# Patient Record
Sex: Male | Born: 1995
Health system: Southern US, Community
[De-identification: ages and names within clinical notes are randomized; demographics above are authoritative.]

## PROBLEM LIST (undated history)

## (undated) DIAGNOSIS — S27329A Contusion of lung, unspecified, initial encounter: Secondary | ICD-10-CM

## (undated) DIAGNOSIS — Z872 Personal history of diseases of the skin and subcutaneous tissue: Secondary | ICD-10-CM

## (undated) HISTORY — DX: Personal history of diseases of the skin and subcutaneous tissue: Z87.2

## (undated) HISTORY — PX: NO PAST SURGERIES: SHX2092

## (undated) HISTORY — DX: Contusion of lung, unspecified, initial encounter: S27.329A

---
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
## 2016-04-28 ENCOUNTER — Ambulatory Visit: Payer: Self-pay | Admitting: Licensed Clinical Social Worker

## 2018-04-14 DIAGNOSIS — S90111A Contusion of right great toe without damage to nail, initial encounter: Secondary | ICD-10-CM | POA: Diagnosis not present

## 2018-06-07 DIAGNOSIS — S27329A Contusion of lung, unspecified, initial encounter: Secondary | ICD-10-CM

## 2018-06-07 HISTORY — DX: Contusion of lung, unspecified, initial encounter: S27.329A

## 2018-06-19 ENCOUNTER — Emergency Department: Payer: 59

## 2018-06-19 ENCOUNTER — Other Ambulatory Visit: Payer: Self-pay

## 2018-06-19 ENCOUNTER — Encounter: Payer: Self-pay | Admitting: Emergency Medicine

## 2018-06-19 ENCOUNTER — Observation Stay
Admission: EM | Admit: 2018-06-19 | Discharge: 2018-06-21 | Disposition: A | Discharge status: Home / Self Care | Payer: 59 | Attending: Internal Medicine | Admitting: Internal Medicine

## 2018-06-19 DIAGNOSIS — R918 Other nonspecific abnormal finding of lung field: Secondary | ICD-10-CM | POA: Insufficient documentation

## 2018-06-19 DIAGNOSIS — R0602 Shortness of breath: Secondary | ICD-10-CM | POA: Insufficient documentation

## 2018-06-19 DIAGNOSIS — S27309A Unspecified injury of lung, unspecified, initial encounter: Secondary | ICD-10-CM | POA: Diagnosis not present

## 2018-06-19 DIAGNOSIS — S27329A Contusion of lung, unspecified, initial encounter: Secondary | ICD-10-CM | POA: Diagnosis present

## 2018-06-19 DIAGNOSIS — R079 Chest pain, unspecified: Secondary | ICD-10-CM | POA: Diagnosis not present

## 2018-06-19 DIAGNOSIS — S27322A Contusion of lung, bilateral, initial encounter: Principal | ICD-10-CM | POA: Diagnosis present

## 2018-06-19 DIAGNOSIS — S299XXA Unspecified injury of thorax, initial encounter: Secondary | ICD-10-CM | POA: Diagnosis not present

## 2018-06-19 DIAGNOSIS — S3991XA Unspecified injury of abdomen, initial encounter: Secondary | ICD-10-CM | POA: Diagnosis not present

## 2018-06-19 DIAGNOSIS — T1490XA Injury, unspecified, initial encounter: Secondary | ICD-10-CM

## 2018-06-19 DIAGNOSIS — R0789 Other chest pain: Secondary | ICD-10-CM | POA: Diagnosis not present

## 2018-06-19 DIAGNOSIS — W208XXA Other cause of strike by thrown, projected or falling object, initial encounter: Secondary | ICD-10-CM | POA: Diagnosis not present

## 2018-06-19 LAB — CBC WITH DIFFERENTIAL/PLATELET
BASOS PCT: 1 %
Basophils Absolute: 0.1 10*3/uL (ref 0–0.1)
Eosinophils Absolute: 0.1 10*3/uL (ref 0–0.7)
Eosinophils Relative: 2 %
HEMATOCRIT: 41.9 % (ref 40.0–52.0)
HEMOGLOBIN: 15.2 g/dL (ref 13.0–18.0)
Lymphocytes Relative: 28 %
Lymphs Abs: 2.5 10*3/uL (ref 1.0–3.6)
MCH: 30.1 pg (ref 26.0–34.0)
MCHC: 36.4 g/dL — AB (ref 32.0–36.0)
MCV: 82.8 fL (ref 80.0–100.0)
MONOS PCT: 7 %
Monocytes Absolute: 0.6 10*3/uL (ref 0.2–1.0)
NEUTROS PCT: 62 %
Neutro Abs: 5.5 10*3/uL (ref 1.4–6.5)
Platelets: 229 10*3/uL (ref 150–440)
RBC: 5.06 MIL/uL (ref 4.40–5.90)
RDW: 13 % (ref 11.5–14.5)
WBC: 8.8 10*3/uL (ref 3.8–10.6)

## 2018-06-19 LAB — BASIC METABOLIC PANEL
ANION GAP: 9 (ref 5–15)
BUN: 17 mg/dL (ref 6–20)
CHLORIDE: 106 mmol/L (ref 98–111)
CO2: 24 mmol/L (ref 22–32)
CREATININE: 1.03 mg/dL (ref 0.61–1.24)
Calcium: 9.2 mg/dL (ref 8.9–10.3)
GFR calc non Af Amer: >60 mL/min (ref >60)
GLUCOSE: 75 mg/dL (ref 70–99)
Potassium: 3.8 mmol/L (ref 3.5–5.1)
Sodium: 139 mmol/L (ref 135–145)

## 2018-06-19 LAB — TYPE AND SCREEN
ABO/RH(D): A POS
ANTIBODY SCREEN: NEGATIVE

## 2018-06-19 LAB — TROPONIN I: Troponin I: <0.03 ng/mL (ref <0.03)

## 2018-06-19 MED ORDER — ACETAMINOPHEN 325 MG PO TABS: 650.0000 mg | ORAL_TABLET | Freq: Four times a day (QID) | ORAL | Status: DC | PRN

## 2018-06-19 MED ORDER — ONDANSETRON HCL 4 MG/2ML IJ SOLN
4.0000 mg | Freq: Once | INTRAMUSCULAR | Status: AC
  Administered 2018-06-19: 4 mg via INTRAVENOUS
  Filled 2018-06-19: qty 2

## 2018-06-19 MED ORDER — FENTANYL CITRATE (PF) 100 MCG/2ML IJ SOLN
50.0000 ug | Freq: Once | INTRAMUSCULAR | Status: AC
  Administered 2018-06-19: 50 ug via INTRAVENOUS

## 2018-06-19 MED ORDER — MORPHINE SULFATE (PF) 4 MG/ML IV SOLN
4.0000 mg | INTRAVENOUS | Status: DC | PRN
  Administered 2018-06-19 – 2018-06-21 (×5): 4 mg via INTRAVENOUS
  Filled 2018-06-19 (×5): qty 1

## 2018-06-19 MED ORDER — ACETAMINOPHEN 650 MG RE SUPP: 650.0000 mg | Freq: Four times a day (QID) | RECTAL | Status: DC | PRN

## 2018-06-19 MED ORDER — OXYCODONE HCL 5 MG PO TABS
5.0000 mg | ORAL_TABLET | ORAL | Status: DC | PRN
  Administered 2018-06-20 – 2018-06-21 (×5): 5 mg via ORAL
  Filled 2018-06-19 (×5): qty 1

## 2018-06-19 MED ORDER — ONDANSETRON HCL 4 MG/2ML IJ SOLN: 4.0000 mg | Freq: Four times a day (QID) | INTRAMUSCULAR | Status: DC | PRN

## 2018-06-19 MED ORDER — HYDROMORPHONE HCL 1 MG/ML IJ SOLN
0.5000 mg | INTRAMUSCULAR | Status: AC
  Administered 2018-06-19: 0.5 mg via INTRAVENOUS
  Filled 2018-06-19: qty 1

## 2018-06-19 MED ORDER — IOHEXOL 300 MG/ML  SOLN
100.0000 mL | Freq: Once | INTRAMUSCULAR | Status: AC | PRN
  Administered 2018-06-19: 100 mL via INTRAVENOUS

## 2018-06-19 MED ORDER — ONDANSETRON HCL 4 MG PO TABS: 4.0000 mg | ORAL_TABLET | Freq: Four times a day (QID) | ORAL | Status: DC | PRN

## 2018-06-19 MED ORDER — FENTANYL CITRATE (PF) 100 MCG/2ML IJ SOLN
INTRAMUSCULAR | Status: AC
  Filled 2018-06-19: qty 2

## 2018-06-19 MED ORDER — FENTANYL CITRATE (PF) 100 MCG/2ML IJ SOLN
75.0000 ug | Freq: Once | INTRAMUSCULAR | Status: AC
  Administered 2018-06-19: 75 ug via INTRAVENOUS
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
## 2018-06-20 ENCOUNTER — Observation Stay: Payer: 59

## 2018-06-20 DIAGNOSIS — R918 Other nonspecific abnormal finding of lung field: Secondary | ICD-10-CM | POA: Diagnosis not present

## 2018-06-20 DIAGNOSIS — S27322A Contusion of lung, bilateral, initial encounter: Secondary | ICD-10-CM | POA: Diagnosis not present

## 2018-06-20 DIAGNOSIS — R0602 Shortness of breath: Secondary | ICD-10-CM | POA: Diagnosis not present

## 2018-06-20 LAB — BASIC METABOLIC PANEL
ANION GAP: 6 (ref 5–15)
BUN: 14 mg/dL (ref 6–20)
CO2: 28 mmol/L (ref 22–32)
Calcium: 9 mg/dL (ref 8.9–10.3)
Chloride: 107 mmol/L (ref 98–111)
Creatinine, Ser: 0.91 mg/dL (ref 0.61–1.24)
GFR calc Af Amer: >60 mL/min (ref >60)
Glucose, Bld: 97 mg/dL (ref 70–99)
Potassium: 3.6 mmol/L (ref 3.5–5.1)
SODIUM: 141 mmol/L (ref 135–145)

## 2018-06-20 LAB — CBC
HCT: 40.5 % (ref 40.0–52.0)
Hemoglobin: 14.1 g/dL (ref 13.0–18.0)
MCH: 29.3 pg (ref 26.0–34.0)
MCHC: 34.8 g/dL (ref 32.0–36.0)
MCV: 84.2 fL (ref 80.0–100.0)
Platelets: 210 10*3/uL (ref 150–440)
RBC: 4.81 MIL/uL (ref 4.40–5.90)
RDW: 13 % (ref 11.5–14.5)
WBC: 9.6 10*3/uL (ref 3.8–10.6)

## 2018-06-20 MED ORDER — IBUPROFEN 400 MG PO TABS
400.0000 mg | ORAL_TABLET | Freq: Four times a day (QID) | ORAL | Status: DC
  Administered 2018-06-20 – 2018-06-21 (×3): 400 mg via ORAL
  Filled 2018-06-20 (×4): qty 1
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
## 2018-06-21 ENCOUNTER — Telehealth: Payer: Self-pay | Admitting: Pediatrics

## 2018-06-21 DIAGNOSIS — S27322A Contusion of lung, bilateral, initial encounter: Secondary | ICD-10-CM | POA: Diagnosis not present

## 2018-06-21 DIAGNOSIS — R0602 Shortness of breath: Secondary | ICD-10-CM | POA: Diagnosis not present

## 2018-06-21 DIAGNOSIS — R918 Other nonspecific abnormal finding of lung field: Secondary | ICD-10-CM | POA: Diagnosis not present

## 2018-06-21 MED ORDER — IBUPROFEN 400 MG PO TABS: 400.0000 mg | ORAL_TABLET | Freq: Four times a day (QID) | ORAL | 0 refills | Status: DC | PRN

## 2018-06-21 MED ORDER — OXYCODONE HCL 5 MG PO TABS: 5.0000 mg | ORAL_TABLET | Freq: Three times a day (TID) | ORAL | 0 refills | Status: DC | PRN

## 2018-06-21 MED ORDER — ACETAMINOPHEN 325 MG PO TABS: 650.0000 mg | ORAL_TABLET | Freq: Four times a day (QID) | ORAL | Status: DC | PRN

## 2018-06-21 NOTE — Telephone Encounter (Signed)
PT called back and I scheduled pt.

## 2018-06-21 NOTE — Telephone Encounter (Signed)
I called the number in the chart that said it was for him and the name on the message was not his or his mother.  I called the home # we have on file and left a message to call the office.

## 2018-06-21 NOTE — Progress Notes (Signed)
Discharge order received. Patient is alert and oriented. Vital signs stable . No signs of acute distress. Discharge instructions given. Patient verbalized understanding. No other issues noted at this time.   

## 2018-06-21 NOTE — Telephone Encounter (Signed)
Okay to add on Thursday at about 1:30PM

## 2018-06-21 NOTE — Telephone Encounter (Signed)
Pt is set up to establish care with Dr Silvio Pate Dec 2019 but needs HFU by Fri 7/19. His past pcp was pediatrician. Is it OK to schedule pt for HFU?  Copied from [place] [phone]. Topic: Appointment Scheduling - Scheduling Inquiry for Clinic >> Jun 21, 2018  9:38 AM Margot Ables wrote: Reason for CRM:  [06/21/2018 9:19 AM]  Inocente Salles:   I have [facility] on the line wanting to schedule hosp f/u for pt who has not been seen as new pt yet but is scheduled with Dr. Silvio Pate. needs seen w/in 6 days. Dr. Silvio Pate doesn't have opening. Can I put with NP?   [06/21/2018 9:22 AM]  Inocente Salles:   Let me know how to proceed.   [06/21/2018 9:25 AM]  Inocente Salles:   I scheduled pt with NP. Please review. MRN [id number]   [06/21/2018 9:32 AM]  Ozzie Hoyle:   Hey sorry for delay was on phone. I spoke with front office mgr and Allie Bossier NP cannot see pt. at this point you could send CRM and we could ck with Dr Silvio Pate to see if he would see pt prior to establishing care but otherwise pt would need to see previous PCP.  I have cancelled the appt - please f/u with the patient on how to proceed. Thank you
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
## 2018-06-22 LAB — HIV ANTIBODY (ROUTINE TESTING W REFLEX): HIV Screen 4th Generation wRfx: NONREACTIVE

## 2018-06-24 ENCOUNTER — Inpatient Hospital Stay: Payer: 59 | Admitting: Internal Medicine

## 2018-06-28 ENCOUNTER — Inpatient Hospital Stay: Payer: 59 | Admitting: Primary Care

## 2018-07-08 ENCOUNTER — Ambulatory Visit (INDEPENDENT_AMBULATORY_CARE_PROVIDER_SITE_OTHER): Payer: 59 | Admitting: Internal Medicine

## 2018-07-08 ENCOUNTER — Encounter: Payer: Self-pay | Admitting: Internal Medicine

## 2018-07-08 VITALS — BP 102/70 | HR 54 | Temp 97.6°F | Ht 71.5 in | Wt 166.0 lb

## 2018-07-08 DIAGNOSIS — Z0000 Encounter for general adult medical examination without abnormal findings: Secondary | ICD-10-CM | POA: Diagnosis not present

## 2018-07-08 DIAGNOSIS — S27322S Contusion of lung, bilateral, sequela: Secondary | ICD-10-CM
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
## 2018-07-08 NOTE — Assessment & Plan Note (Signed)
Healthy Counseling done Will check HIV Yearly flu vaccine recommended He thinks he got Td at [facility] 2 years ago--- he will check

## 2018-07-08 NOTE — Assessment & Plan Note (Signed)
Still has some chest, back and neck symptoms that are likely related to this Discussed that I expect resolution over time

## 2018-07-08 NOTE — Patient Instructions (Signed)
Please remember to get the immunization record from [facility] and send it to me

## 2018-07-10 LAB — HIV ANTIBODY (ROUTINE TESTING W REFLEX): HIV 1&2 Ab, 4th Generation: NONREACTIVE

## 2018-12-07 ENCOUNTER — Ambulatory Visit: Payer: Self-pay | Admitting: Internal Medicine

## 2018-12-31 DIAGNOSIS — L700 Acne vulgaris: Secondary | ICD-10-CM | POA: Diagnosis not present

## 2019-01-13 DIAGNOSIS — J3489 Other specified disorders of nose and nasal sinuses: Secondary | ICD-10-CM | POA: Diagnosis not present

## 2019-01-13 DIAGNOSIS — J309 Allergic rhinitis, unspecified: Secondary | ICD-10-CM | POA: Diagnosis not present

## 2019-02-14 DIAGNOSIS — J309 Allergic rhinitis, unspecified: Secondary | ICD-10-CM | POA: Diagnosis not present

## 2019-02-14 DIAGNOSIS — J300 Vasomotor rhinitis: Secondary | ICD-10-CM | POA: Diagnosis not present

## 2019-02-14 DIAGNOSIS — J3489 Other specified disorders of nose and nasal sinuses: Secondary | ICD-10-CM | POA: Diagnosis not present

## 2019-05-05 DIAGNOSIS — L700 Acne vulgaris: Secondary | ICD-10-CM | POA: Diagnosis not present

## 2019-05-05 DIAGNOSIS — D2262 Melanocytic nevi of left upper limb, including shoulder: Secondary | ICD-10-CM | POA: Diagnosis not present

## 2019-05-05 DIAGNOSIS — D2261 Melanocytic nevi of right upper limb, including shoulder: Secondary | ICD-10-CM | POA: Diagnosis not present

## 2019-05-05 DIAGNOSIS — D2271 Melanocytic nevi of right lower limb, including hip: Secondary | ICD-10-CM | POA: Diagnosis not present

## 2019-05-05 DIAGNOSIS — D485 Neoplasm of uncertain behavior of skin: Secondary | ICD-10-CM | POA: Diagnosis not present

## 2019-05-05 DIAGNOSIS — D2272 Melanocytic nevi of left lower limb, including hip: Secondary | ICD-10-CM | POA: Diagnosis not present

## 2019-05-05 DIAGNOSIS — D224 Melanocytic nevi of scalp and neck: Secondary | ICD-10-CM | POA: Diagnosis not present

## 2019-05-05 DIAGNOSIS — D225 Melanocytic nevi of trunk: Secondary | ICD-10-CM | POA: Diagnosis not present

## 2019-06-05 DIAGNOSIS — Z1159 Encounter for screening for other viral diseases: Secondary | ICD-10-CM | POA: Diagnosis not present
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
## 2020-03-26 IMAGING — CR DG CHEST 2V
2 series · 2 of 2 positions shown · non-contrast
Comparison: 06/19/2018

CLINICAL DATA: Patient reports continued SOB and difficulty
breathing following a trauma to his chest yesterday. Patient denies
any known heart or lung conditions. Non-smoker.

EXAM:
CHEST - 2 VIEW

[chest pa]
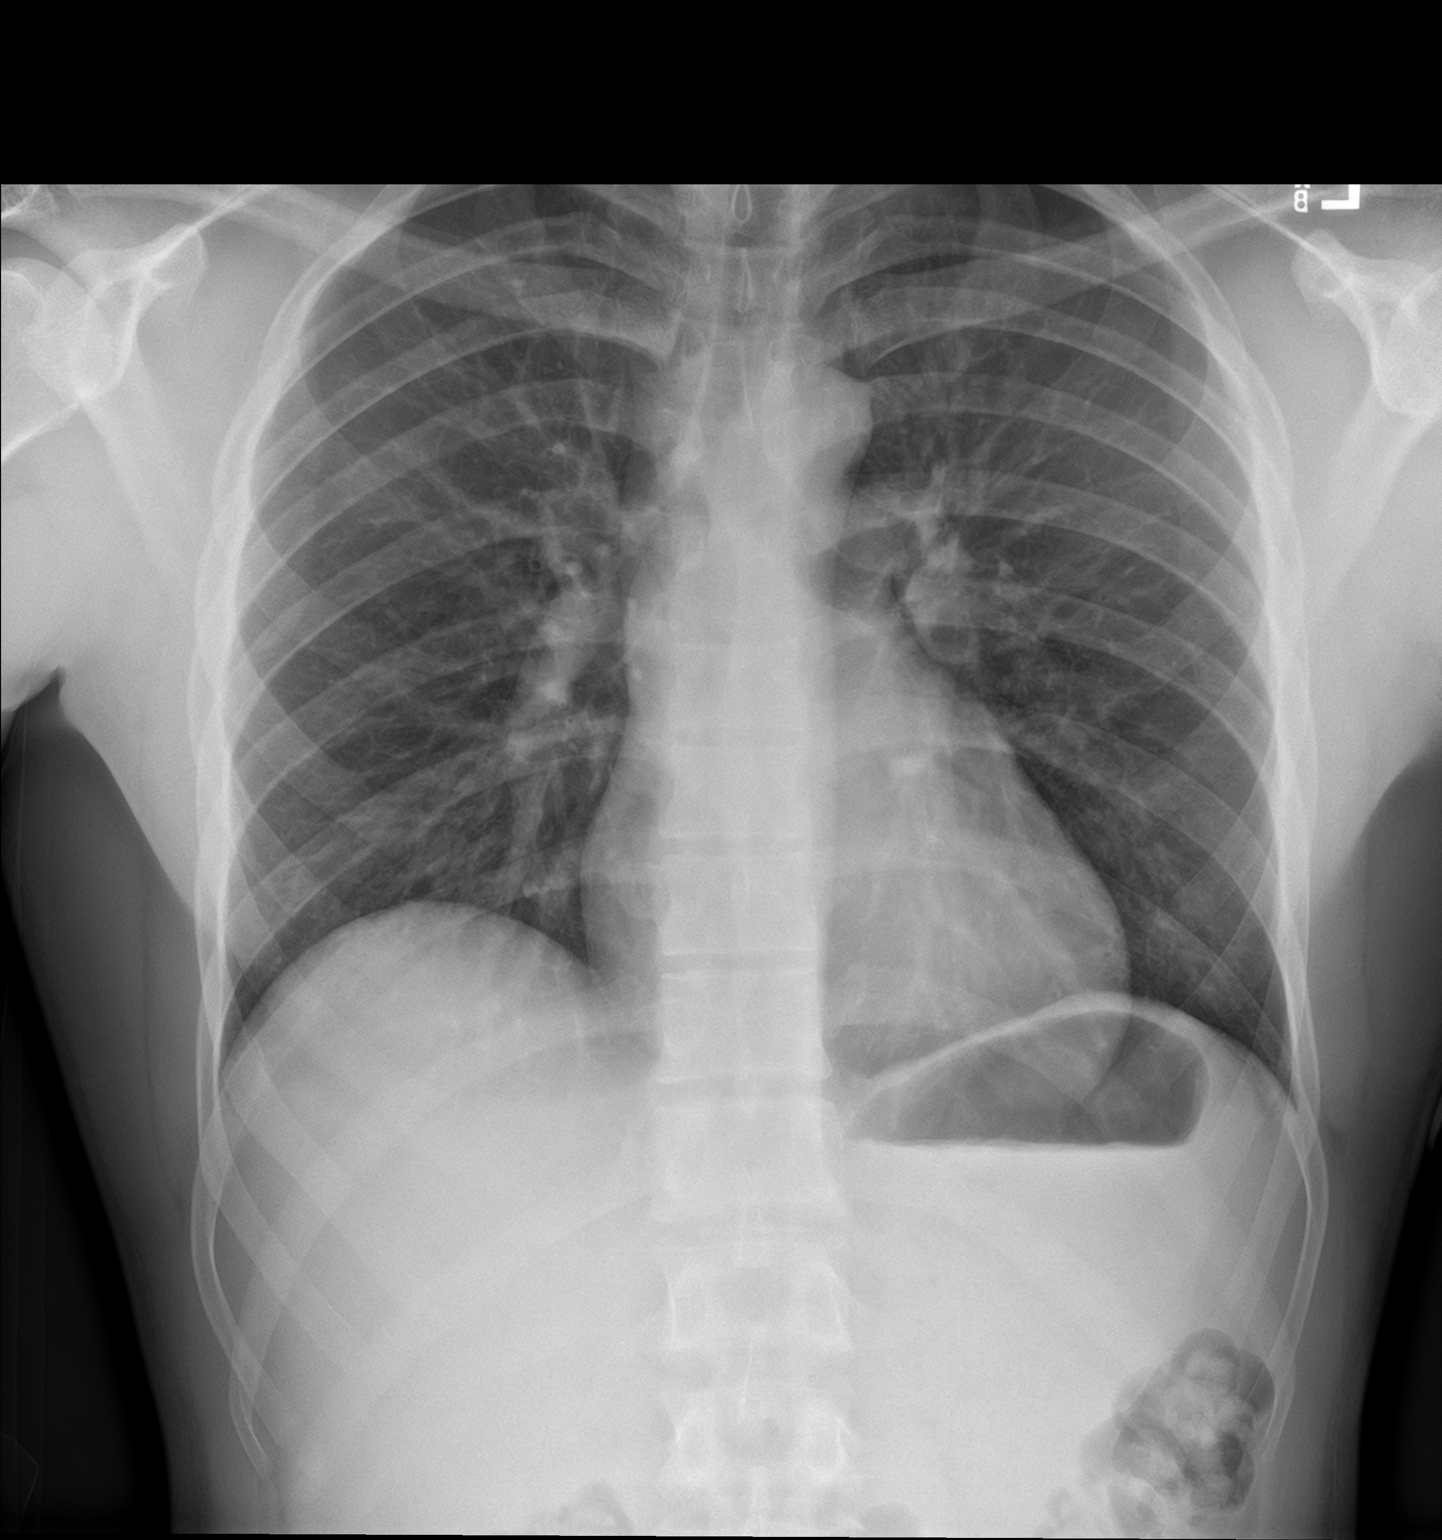

[chest lat]
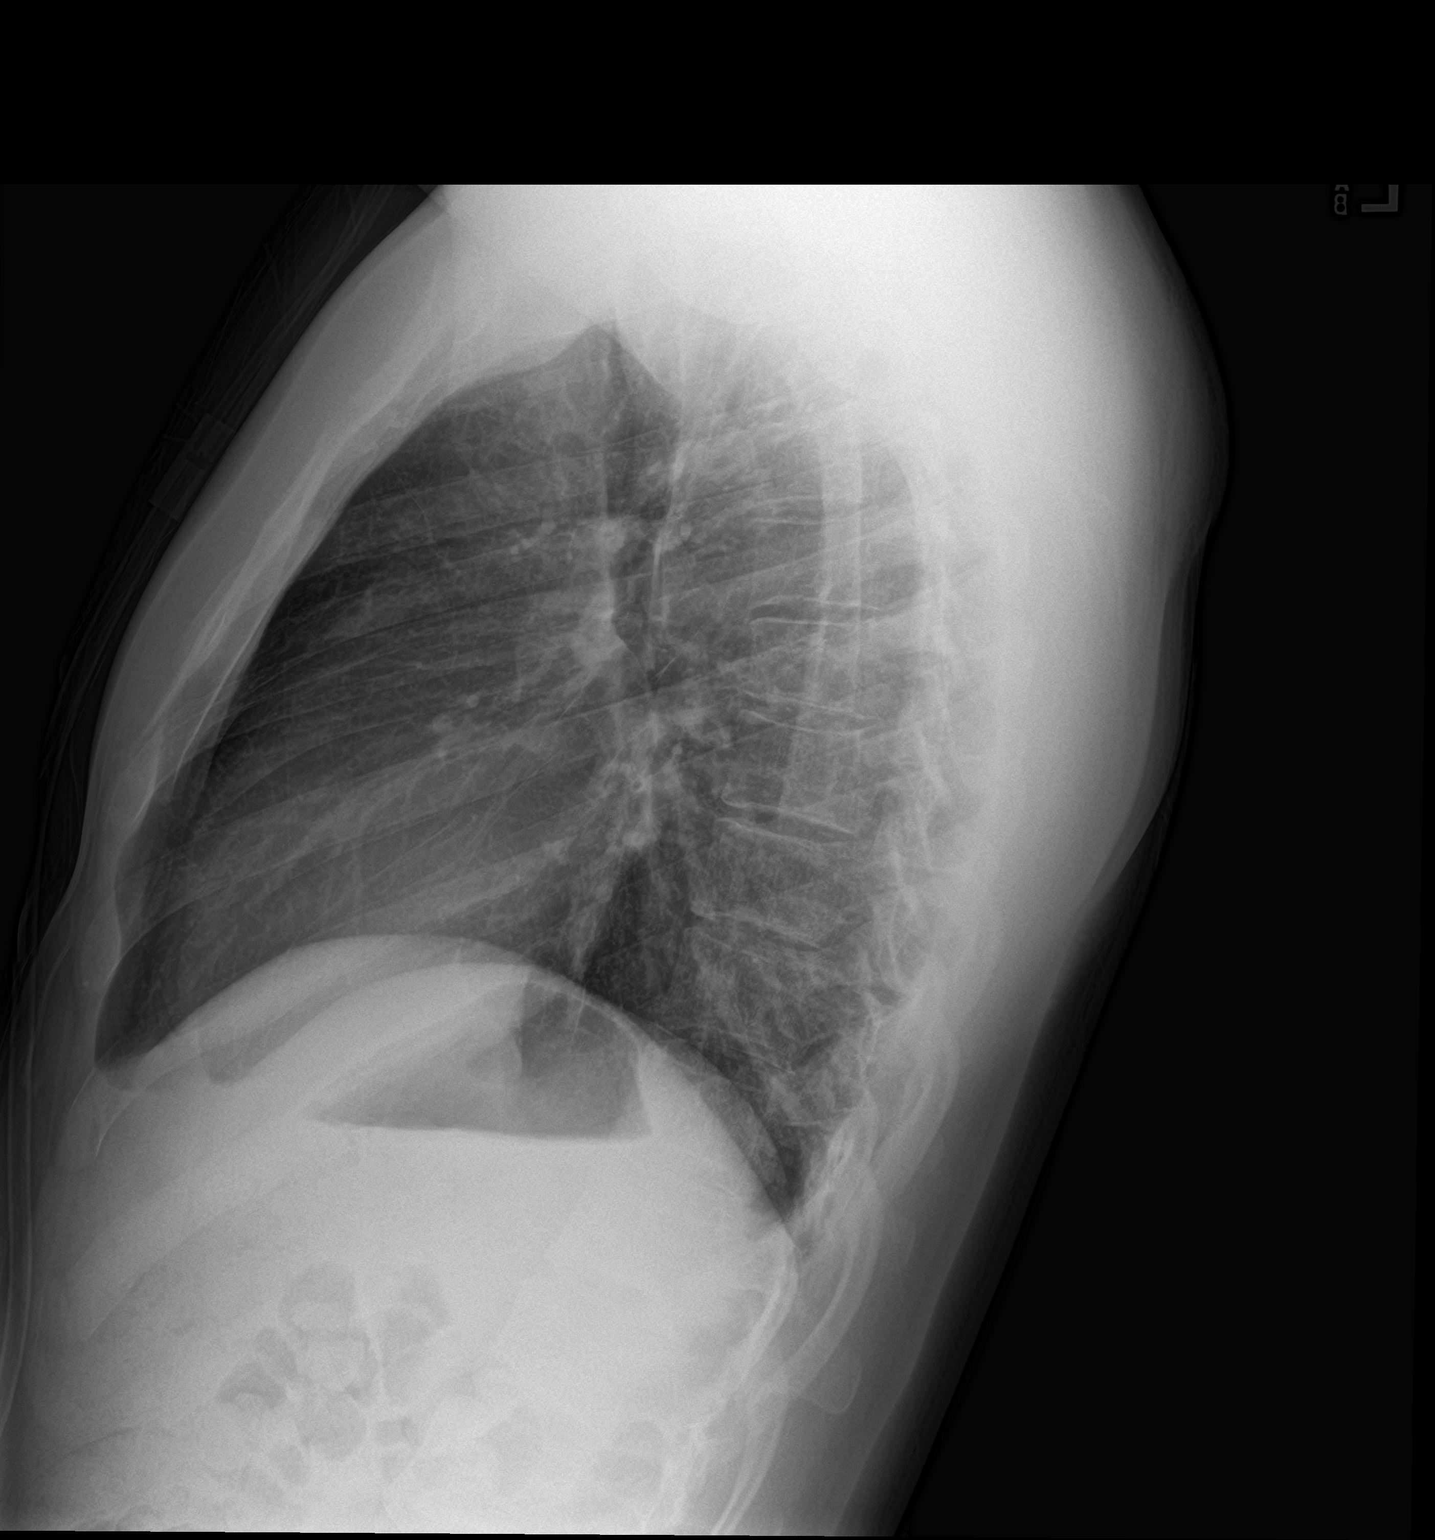

[2 of 2 positions shown; findings below may reference images not displayed]

FINDINGS: The heart size and mediastinal contours are within normal limits.
Both lungs are clear. The visualized skeletal structures are
unremarkable. No pneumothorax.
IMPRESSION: No active cardiopulmonary disease.

## 2020-05-21 DIAGNOSIS — Z20822 Contact with and (suspected) exposure to covid-19: Secondary | ICD-10-CM | POA: Diagnosis not present

## 2020-09-06 DIAGNOSIS — F419 Anxiety disorder, unspecified: Secondary | ICD-10-CM | POA: Insufficient documentation

## 2021-01-31 DIAGNOSIS — H5213 Myopia, bilateral: Secondary | ICD-10-CM | POA: Diagnosis not present

## 2022-11-13 ENCOUNTER — Ambulatory Visit (INDEPENDENT_AMBULATORY_CARE_PROVIDER_SITE_OTHER): Payer: BC Managed Care – PPO | Admitting: Internal Medicine

## 2022-11-13 ENCOUNTER — Encounter: Payer: Self-pay | Admitting: Internal Medicine

## 2022-11-13 VITALS — BP 98/70 | HR 68 | Temp 97.3°F | Ht 71.5 in | Wt 195.0 lb

## 2022-11-13 DIAGNOSIS — Z23 Encounter for immunization: Secondary | ICD-10-CM | POA: Diagnosis not present

## 2022-11-13 DIAGNOSIS — R002 Palpitations: Secondary | ICD-10-CM

## 2022-11-13 DIAGNOSIS — Z0000 Encounter for general adult medical examination without abnormal findings: Secondary | ICD-10-CM | POA: Diagnosis not present

## 2022-11-13 LAB — CBC
HCT: 44.6 % (ref 39.0–52.0)
Hemoglobin: 15.3 g/dL (ref 13.0–17.0)
MCHC: 34.3 g/dL (ref 30.0–36.0)
MCV: 83.6 fl (ref 78.0–100.0)
Platelets: 251 10*3/uL (ref 150.0–400.0)
RBC: 5.34 Mil/uL (ref 4.22–5.81)
RDW: 12.6 % (ref 11.5–15.5)
WBC: 5.7 10*3/uL (ref 4.0–10.5)

## 2022-11-13 LAB — COMPREHENSIVE METABOLIC PANEL
ALT: 14 U/L (ref 0–53)
AST: 15 U/L (ref 0–37)
Albumin: 4.9 g/dL (ref 3.5–5.2)
Alkaline Phosphatase: 55 U/L (ref 39–117)
BUN: 21 mg/dL (ref 6–23)
CO2: 31 mEq/L (ref 19–32)
Calcium: 9.7 mg/dL (ref 8.4–10.5)
Chloride: 102 mEq/L (ref 96–112)
Creatinine, Ser: 1.14 mg/dL (ref 0.40–1.50)
GFR: 87.93 mL/min (ref >60.00)
Glucose, Bld: 88 mg/dL (ref 70–99)
Potassium: 4.1 mEq/L (ref 3.5–5.1)
Sodium: 138 mEq/L (ref 135–145)
Total Bilirubin: 1.8 mg/dL — ABNORMAL HIGH (ref 0.2–1.2)
Total Protein: 7.2 g/dL (ref 6.0–8.3)

## 2022-11-13 LAB — LIPID PANEL
Cholesterol: 187 mg/dL (ref 0–200)
HDL: 58.8 mg/dL (ref >39.00)
LDL Cholesterol: 103 mg/dL — ABNORMAL HIGH (ref 0–99)
NonHDL: 128.16
Total CHOL/HDL Ratio: 3
Triglycerides: 126 mg/dL (ref 0.0–149.0)
VLDL: 25.2 mg/dL (ref 0.0–40.0)

## 2022-11-13 LAB — TSH: TSH: 1.73 u[IU]/mL (ref 0.35–5.50)

## 2022-11-13 NOTE — Assessment & Plan Note (Signed)
Healthy Tdap today Stays in shape Prefers no flu or COVID vaccines

## 2022-11-13 NOTE — Assessment & Plan Note (Signed)
Mild spell ?caffeine related--though several other spells after stopping Will just check labs

## 2022-11-13 NOTE — Addendum Note (Signed)
Addended by: Pilar Grammes on: 11/13/2022 12:33 PM   Modules accepted: Orders

## 2022-11-13 NOTE — Progress Notes (Signed)
Subjective:    Patient ID: Jerry Werner, male    DOB: Aug 10, 1996, 26 y.o.   MRN: [id number]  HPI Here to re-establish care and for physical It has been over 4 years  Still notes stress with work/home issues Was in gym--got tightness in chest and vision "narrowing down a little"---last week Went away but has recurred twice since then Not similar to past symptoms---more like social avoidance in the past Therapy in the past--felt this is better Quit energy drinks/caffeine after spell  No change in exercise regimen--regular in gym Cardio and weights  Stress at work In relationship ---overall a good thing Trouble initiating sleep--but then sleeps okay. Eats before bed--it helps  Review of Systems  Constitutional:  Negative for fatigue.       Weight is up with body building Wears seat belt  HENT:  Negative for dental problem, hearing loss and tinnitus.        Due for dental visit  Eyes:  Negative for visual disturbance.       No diplopia or unilateral vision loss  Respiratory:  Negative for cough, chest tightness and shortness of breath.   Cardiovascular:  Positive for palpitations. Negative for chest pain and leg swelling.  Gastrointestinal:  Negative for blood in stool and constipation.       Occ indigestion---no meds  Endocrine: Negative for polydipsia and polyuria.  Genitourinary:  Negative for difficulty urinating and urgency.       No sexual problems  Musculoskeletal:  Negative for arthralgias, back pain and joint swelling.       Some muscle pains--like in pecs  Skin:  Negative for rash.  Allergic/Immunologic: Positive for environmental allergies. Negative for immunocompromised state.       Hasn't needed meds lately  Neurological:  Negative for syncope and light-headedness.       Rare stress headaches  Hematological:  Negative for adenopathy. Does not bruise/bleed easily.  Psychiatric/Behavioral:         Anxious in the past week--but not generally No depression        Objective:   Physical Exam Constitutional:      Appearance: Normal appearance.  HENT:     Mouth/Throat:     Pharynx: No oropharyngeal exudate or posterior oropharyngeal erythema.  Eyes:     Conjunctiva/sclera: Conjunctivae normal.     Pupils: Pupils are equal, round, and reactive to light.  Cardiovascular:     Rate and Rhythm: Normal rate and regular rhythm.     Pulses: Normal pulses.     Heart sounds: No murmur heard.    No gallop.  Pulmonary:     Effort: Pulmonary effort is normal.     Breath sounds: Normal breath sounds. No wheezing or rales.  Abdominal:     Palpations: Abdomen is soft.     Tenderness: There is no abdominal tenderness.  Genitourinary:    Testes: Normal.  Musculoskeletal:     Cervical back: Neck supple.     Right lower leg: No edema.     Left lower leg: No edema.  Lymphadenopathy:     Cervical: No cervical adenopathy.  Skin:    Findings: No lesion or rash.     Comments: Numerous nevi (sees derm regularly)  Neurological:     General: No focal deficit present.     Mental Status: He is alert and oriented to person, place, and time.  Psychiatric:        Mood and Affect: Mood normal.  Behavior: Behavior normal.            Assessment & Plan:

## 2023-02-15 DIAGNOSIS — S335XXA Sprain of ligaments of lumbar spine, initial encounter: Secondary | ICD-10-CM | POA: Diagnosis not present

## 2023-08-06 DIAGNOSIS — D225 Melanocytic nevi of trunk: Secondary | ICD-10-CM | POA: Diagnosis not present

## 2023-08-06 DIAGNOSIS — L700 Acne vulgaris: Secondary | ICD-10-CM | POA: Diagnosis not present

## 2023-08-06 DIAGNOSIS — D2262 Melanocytic nevi of left upper limb, including shoulder: Secondary | ICD-10-CM | POA: Diagnosis not present

## 2023-08-06 DIAGNOSIS — D2261 Melanocytic nevi of right upper limb, including shoulder: Secondary | ICD-10-CM | POA: Diagnosis not present

## 2023-10-22 ENCOUNTER — Encounter: Payer: Self-pay | Admitting: Internal Medicine

## 2023-10-22 ENCOUNTER — Telehealth: Payer: BC Managed Care – PPO | Admitting: Internal Medicine

## 2023-10-22 VITALS — Ht 71.5 in | Wt 191.0 lb

## 2023-10-22 DIAGNOSIS — R002 Palpitations: Secondary | ICD-10-CM | POA: Diagnosis not present

## 2023-10-22 NOTE — Progress Notes (Signed)
   Subjective:    Patient ID: Jerry Werner, male    DOB: Nov 02, 1996, 27 y.o.   MRN: [id number]  HPI Virtual visit due to dizziness and SOB again Identification done Reviewed limitations and billing and he gave consent Participants---patient is at work and I am in my office  Having "weird dizzy spells" and SOB--like he can't breath right Mostly notes it in the gym Almost lost consciousness recently Considers this his safe place--so doesn't seem to be anxiety related  Will start working out--and notes change in vision "like shaky" When he pushes it--gets tunnel vision and had to lie down Similar to what was happening earlier in the year Had improved but now worse again  Feels he hydrates well Occasionally gets palpitations--could be secondary anxiety once the symptoms start No chest pain--but gets a tight feeling  Current Outpatient Medications on File Prior to Visit  Medication Sig Dispense Refill   CREATINE PO Take by mouth.     Dietary Management Product (NEOKE BCAA4) POWD Take by mouth.     Multiple Vitamin (MULTIVITAMIN) capsule Take 1 capsule by mouth daily.     No current facility-administered medications on file prior to visit.    No Known Allergies  Past Medical History:  Diagnosis Date   Hx of acne    [facility]   Hx of dermatitis    [facility]   Lung contusion 06/2018    Past Surgical History:  Procedure Laterality Date   NO PAST SURGERIES      Family History  Problem Relation Age of Onset   Breast cancer Paternal Aunt    Dementia Paternal Grandmother    Lung cancer Paternal Grandfather    Diabetes Neg Hx     Social History   Socioeconomic History   Marital status: Single    Spouse name: Not on file   Number of children: 0   Years of education: Not on file   Highest education level: Not on file  Occupational History   Occupation: Environmental Health    Comment: [employer]  Tobacco Use   Smoking status: Never    Passive exposure: Never    Smokeless tobacco: Never  Substance and Sexual Activity   Alcohol use: Yes    Comment: weekends   Drug use: Never   Sexual activity: Not on file  Other Topics Concern   Not on file  Social History Narrative   Not on file   Social Determinants of Health   Financial Resource Strain: Not on file  Food Insecurity: Not on file  Transportation Needs: Not on file  Physical Activity: Not on file  Stress: Not on file  Social Connections: Not on file  Intimate Partner Violence: Not on file   Review of Systems No recent illnesses Sleeps okay--better lately No COVID vaccines--but had COVID twice (not recently)    Objective:   Physical Exam Constitutional:      Appearance: Normal appearance.  Pulmonary:     Effort: Pulmonary effort is normal. No respiratory distress.  Neurological:     Mental Status: He is alert.            Assessment & Plan:

## 2023-10-22 NOTE — Assessment & Plan Note (Signed)
With what sounds like orthostatic dizziness Seems to be most prominent during exercise---which is concerning Will set up with cardiology for evaluation---will need echo (to see LV function and make sure he doesn't have pericardial process) and likely Zio monitor

## 2023-11-03 ENCOUNTER — Other Ambulatory Visit: Payer: Self-pay

## 2023-11-03 ENCOUNTER — Emergency Department: Payer: BC Managed Care – PPO

## 2023-11-03 ENCOUNTER — Emergency Department
Admission: EM | Admit: 2023-11-03 | Discharge: 2023-11-03 | Disposition: A | Discharge status: Home / Self Care | Payer: BC Managed Care – PPO | Attending: Emergency Medicine | Admitting: Emergency Medicine

## 2023-11-03 ENCOUNTER — Encounter: Payer: Self-pay | Admitting: Emergency Medicine

## 2023-11-03 DIAGNOSIS — R002 Palpitations: Secondary | ICD-10-CM | POA: Insufficient documentation

## 2023-11-03 DIAGNOSIS — I499 Cardiac arrhythmia, unspecified: Secondary | ICD-10-CM | POA: Diagnosis not present

## 2023-11-03 DIAGNOSIS — I959 Hypotension, unspecified: Secondary | ICD-10-CM | POA: Diagnosis not present

## 2023-11-03 DIAGNOSIS — R0789 Other chest pain: Secondary | ICD-10-CM | POA: Diagnosis not present

## 2023-11-03 LAB — BASIC METABOLIC PANEL
Anion gap: 8 (ref 5–15)
BUN: 21 mg/dL — ABNORMAL HIGH (ref 6–20)
CO2: 26 mmol/L (ref 22–32)
Calcium: 8.9 mg/dL (ref 8.9–10.3)
Chloride: 101 mmol/L (ref 98–111)
Creatinine, Ser: 1.32 mg/dL — ABNORMAL HIGH (ref 0.61–1.24)
GFR, Estimated: >60 mL/min (ref >60)
Glucose, Bld: 157 mg/dL — ABNORMAL HIGH (ref 70–99)
Potassium: 3 mmol/L — ABNORMAL LOW (ref 3.5–5.1)
Sodium: 135 mmol/L (ref 135–145)

## 2023-11-03 LAB — CBC
HCT: 44.7 % (ref 39.0–52.0)
Hemoglobin: 15.2 g/dL (ref 13.0–17.0)
MCH: 28.4 pg (ref 26.0–34.0)
MCHC: 34 g/dL (ref 30.0–36.0)
MCV: 83.6 fL (ref 80.0–100.0)
Platelets: 259 10*3/uL (ref 150–400)
RBC: 5.35 MIL/uL (ref 4.22–5.81)
RDW: 12.1 % (ref 11.5–15.5)
WBC: 5.3 10*3/uL (ref 4.0–10.5)
nRBC: 0 % (ref 0.0–0.2)

## 2023-11-03 LAB — TROPONIN I (HIGH SENSITIVITY): Troponin I (High Sensitivity): <2 ng/L (ref <18)

## 2023-11-03 NOTE — ED Triage Notes (Signed)
Pt comes via [facility] from [employer] with c/o cp. Pt was diaphoretic hypotensive per [facility]. Pt given 324 aspirin, 20 g in right AC  Pt states has been having cp since March and hasn't seen cardiologist yet.

## 2023-11-03 NOTE — ED Notes (Signed)
See triage notes. Patient c/o sudden onset of palpitations and near syncope earlier today. Patient also had some shortness of breath and chest tightness (the tightness has resolved).

## 2023-11-03 NOTE — ED Triage Notes (Signed)
Pt via [facility] from home. Pt states he was sitting in his office and then he had a sudden onset of palpitations and near syncope. Report some SOB. Reports chest tightness that has resolved. See first nurse note. Pt is A&Ox4 and NAD

## 2023-11-03 NOTE — Discharge Instructions (Signed)
As we discussed please limit or discontinue caffeine and nicotine, limit alcohol intake and increase sleep if you are able.  Drink plenty of noncaffeinated beverages throughout the day.  Return to the emergency department for any chest pain trouble breathing or any symptom concerning to yourself.  Otherwise please call the number provided for cardiology to arrange a follow-up appointment.

## 2023-11-03 NOTE — ED Provider Notes (Signed)
[facility] Provider Note    Event Date/Time   First MD Initiated Contact with Patient 11/03/23 1731     (approximate)  History   Chief Complaint: Chest Pain  HPI  Jerry Werner is a 27 y.o. male with a past medical history of anxiety who presents to the emergency department for palpitations.  According to the patient for the past several weeks he has been experiencing worsening palpitations.  He states they are mostly with exertion such as at the gym.  He states he will feel his heart beating irregularly or skipping beats followed by a sensation of dizziness or lightheadedness, often followed by significant fatigue following the event.  Patient denies any chest pain at any point.  Mild shortness of breath during the event.  Patient has seen his doctor and has been referred to a cardiologist but is not until next month.  Physical Exam   Triage Vital Signs: ED Triage Vitals  Encounter Vitals Group     BP 11/03/23 1530 125/76     Systolic BP Percentile --      Diastolic BP Percentile --      Pulse Rate 11/03/23 1530 67     Resp 11/03/23 1530 17     Temp 11/03/23 1530 98.2 F (36.8 C)     Temp Source 11/03/23 1530 Oral     SpO2 11/03/23 1530 100 %     Weight 11/03/23 1523 190 lb (86.2 kg)     Height 11/03/23 1523 6' (1.829 m)     Head Circumference --      Peak Flow --      Pain Score 11/03/23 1532 0     Pain Loc --      Pain Education --      Exclude from Growth Chart --     Most recent vital signs: Vitals:   11/03/23 1530  BP: 125/76  Pulse: 67  Resp: 17  Temp: 98.2 F (36.8 C)  SpO2: 100%    General: Awake, no distress.  CV:  Good peripheral perfusion.  Regular rate and rhythm  Resp:  Normal effort.  Equal breath sounds bilaterally.  Abd:  No distention.  Soft, nontender.  No rebound or guarding.  ED Results / Procedures / Treatments   EKG  EKG viewed and interpreted by myself shows normal sinus rhythm at 62 bpm with a narrow QRS,  normal axis, normal intervals, no concerning ST changes  RADIOLOGY  I have reviewed and interpreted chest x-ray images.  No consolidation on my evaluation. Radiology has read the x-ray as negative   MEDICATIONS ORDERED IN ED: Medications - No data to display   IMPRESSION / MDM / ASSESSMENT AND PLAN / ED COURSE  I reviewed the triage vital signs and the nursing notes.  Patient's presentation is most consistent with acute presentation with potential threat to life or bodily function.  Patient presents to the emergency department for intermittent palpitations.  Overall the patient appears well, reassuring CBC, reassuring chemistry.  Troponin is negative.  EKG is reassuring and chest x-ray is clear.  I discussed with the patient cardiology follow-up for a Holter monitor to further evaluate the patient's palpitations.  I discussed limiting caffeine and nicotine and other stimulants, increasing sleep and limiting alcohol.  Discussed return precautions.  Patient's symptoms are suggestive of palpitations possibly in conjunction with anxiety/panic when the symptoms occur.  However I do believe it is important the patient obtains a Holter monitor to rule out  any concerning arrhythmia.  FINAL CLINICAL IMPRESSION(S) / ED DIAGNOSES   palpitations   Note:  This document was prepared using Dragon voice recognition software and may include unintentional dictation errors.   Minna Antis, MD 11/03/23 1751

## 2023-11-10 ENCOUNTER — Other Ambulatory Visit (HOSPITAL_COMMUNITY): Payer: Self-pay | Admitting: Internal Medicine

## 2023-11-10 ENCOUNTER — Ambulatory Visit (HOSPITAL_COMMUNITY)
Admission: RE | Admit: 2023-11-10 | Discharge: 2023-11-10 | Disposition: A | Discharge status: Home / Self Care | Payer: BC Managed Care – PPO | Source: Ambulatory Visit | Attending: Internal Medicine | Admitting: Internal Medicine

## 2023-11-10 ENCOUNTER — Ambulatory Visit: Payer: BC Managed Care – PPO | Attending: Internal Medicine | Admitting: Internal Medicine

## 2023-11-10 VITALS — BP 112/76 | HR 81 | Wt 199.1 lb

## 2023-11-10 DIAGNOSIS — R002 Palpitations: Secondary | ICD-10-CM

## 2023-11-10 DIAGNOSIS — F418 Other specified anxiety disorders: Secondary | ICD-10-CM | POA: Diagnosis not present

## 2023-11-10 DIAGNOSIS — F4322 Adjustment disorder with anxiety: Secondary | ICD-10-CM | POA: Diagnosis not present

## 2023-11-10 DIAGNOSIS — F419 Anxiety disorder, unspecified: Secondary | ICD-10-CM | POA: Diagnosis not present

## 2023-11-10 DIAGNOSIS — R9431 Abnormal electrocardiogram [ECG] [EKG]: Secondary | ICD-10-CM | POA: Diagnosis not present

## 2023-11-10 DIAGNOSIS — R55 Syncope and collapse: Secondary | ICD-10-CM | POA: Diagnosis not present

## 2023-11-10 MED ORDER — ESCITALOPRAM OXALATE 20 MG PO TABS: 20.0000 mg | ORAL_TABLET | Freq: Every day | ORAL | 1 refills | Status: DC

## 2023-11-10 NOTE — Progress Notes (Unsigned)
Late entry from 10:30 am: Zio patch placed onto patient.  All instructions and information reviewed with patient, they verbalize understanding with no questions.

## 2023-11-10 NOTE — Progress Notes (Unsigned)
ADVANCED HF CLINIC CONSULT NOTE  Referring Physician: Bernadette Hoit, MD Primary Care: Bernadette Hoit, MD Primary Cardiologist: None  HPI:  Jerry Werner is a 27 y/o male with h/o anxiety who presents for evaluation of recurrent presyncope.  Was active in HS playing baseball. No issues   Went to college at [employer]. Was very active. Played intramurals and weightlifting with no problems. 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 Going back to school to get an engineering degree.  Over past 7-9 months has been having presyncopal episodes.   The first episde was when he was on adductor machine in the gym. Had pain inhis neck and shot up to his head felt presyncopal. Thought it was low blood sugar. Lasted 15 mins  Improved for awhile and now got worse. Has some days when he can workout and is fine. Also can have episodes at rest and in stressful situation.   Had his biggest episode last week when he was at work.  Felt SOB. Lightheaded and tunnel vision. Very diaphorectic. Checked BP 88/40. Chest was tight. Went to [employer]. BP confirmed 80/40. ECG done. Unremarkable. Recovered without intervention  Recently also having panic attacks. Previously was seen for anxiety and was placed on meds. Only on meds for 6 months.   Hydrates well before workouts with salt water. Has been taking caffeine and drinking Monster energy drinks pre workout but has now stopped. Takes other supplements but nothing that brought up concern. Has been using nicotine packets.  FamHx: No family h/o of syncope or SCD. Grandfather had AF and HF    Past Medical History:  Diagnosis Date   Hx of acne    [facility]   Hx of dermatitis    [facility]   Lung contusion 06/2018    Current Outpatient Medications  Medication Sig Dispense Refill   CREATINE PO Take by mouth.     Dietary Management Product (NEOKE BCAA4) POWD Take by mouth.     Multiple Vitamin  (MULTIVITAMIN) capsule Take 1 capsule by mouth daily.     No current facility-administered medications for this visit.    No Known Allergies    Social History   Socioeconomic History   Marital status: Single    Spouse name: Not on file   Number of children: 0   Years of education: Not on file   Highest education level: Not on file  Occupational History   Occupation: Environmental Health    Comment: [employer]  Tobacco Use   Smoking status: Never    Passive exposure: Never   Smokeless tobacco: Never  Substance and Sexual Activity   Alcohol use: Yes    Comment: weekends   Drug use: Never   Sexual activity: Not on file  Other Topics Concern   Not on file  Social History Narrative   Not on file   Social Determinants of Health   Financial Resource Strain: Not on file  Food Insecurity: Not on file  Transportation Needs: Not on file  Physical Activity: Not on file  Stress: Not on file  Social Connections: Not on file  Intimate Partner Violence: Not on file      Family History  Problem Relation Age of Onset   Breast cancer Paternal Aunt    Dementia Paternal Grandmother    Lung cancer Paternal Grandfather    Diabetes Neg Hx     Vitals:   11/10/23 0856  BP: 112/76  Pulse:  81  SpO2: 100%  Weight: 199 lb 2 oz (90.3 kg)    PHYSICAL EXAM: General:  Well appearing. Muscular No respiratory difficulty HEENT: normal Neck: supple. no JVD. Carotids 2+ bilat; no bruits. No lymphadenopathy or thryomegaly appreciated. Cor: PMI nondisplaced. Regular rate & rhythm. No rubs, gallops or murmurs. Lungs: clear Abdomen: soft, nontender, nondistended. No hepatosplenomegaly. No bruits or masses. Good bowel sounds. Extremities: no cyanosis, clubbing, rash, edema Neuro: alert & oriented x 3, cranial nerves grossly intact. moves all 4 extremities w/o difficulty. Affect pleasant but anxious  ECG: NSR 80 prominent voltage. Normal intervals. Personally reviewed  POCUS echo performed  personally in clinic: EF 60% no LVH. RV normal Personally reviewed   ASSESSMENT & PLAN:  1. Recurrent presyncopal episodes - I suspect these are vasovagal episodes exacerbated by emotional stress/anxiety - POCUS echo in clinic appears normal. Will get formal echo, Zio monitor and exercise stress test to rule out underling cardiac pathology - Advised stress relief tactics (see below), continues prehydration, compression stockings. Stop caffeine  2. Anxiety, severe (situational) - he has numerous significant social/work stressors - start Lexapro 10mg  daily for 1 week then increase to 20mg  - encouraged him to seek counseling to help with anxiety  Arvilla Meres, MD  9:26 AM

## 2023-11-10 NOTE — Patient Instructions (Addendum)
Great to see you today!!!  START Lexapro: take 1/2 tab (10 mg) Daily for a week then increase to 1 tab (20 mg) Daily  Your provider has recommended that  you wear a Zio Patch for 14 days.  This monitor will record your heart rhythm for our review.  IF you have any symptoms while wearing the monitor please press the button.  If you have any issues with the patch or you notice a red or orange light on it please call the company at [phone].  Once you remove the patch please mail it back to the company as soon as possible so we can get the results.  Your physician has requested that you have an echocardiogram. Echocardiography is a painless test that uses sound waves to create images of your heart. It provides your doctor with information about the size and shape of your heart and how well your heart's chambers and valves are working. This procedure takes approximately one hour. There are no restrictions for this procedure. Please do NOT wear cologne, perfume, aftershave, or lotions (deodorant is allowed). Please arrive 15 minutes prior to your appointment time.  Please note: We ask at that you not bring children with you during ultrasound (echo/ vascular) testing. Due to room size and safety concerns, children are not allowed in the ultrasound rooms during exams. Our front office staff cannot provide observation of children in our lobby area while testing is being conducted. An adult accompanying a patient to their appointment will only be allowed in the ultrasound room at the discretion of the ultrasound technician under special circumstances. We apologize for any inconvenience.   Exercise Stress Test  An exercise stress test is a test to check how your heart works during exercise and checks for coronary artery disease. Your heart rate will be watched while you are resting and while you are exercising.  Patches (electrodes) will be put on your chest. Do not put lotions, powders, creams, or oils  on your chest before the test. Wires will be connected to the patches. The wires will send signals to a machine to record your heartbeat. Wear comfortable shoes and clothing. Follow instructions from your doctor about what you cannot eat or drink before the test.  Before the procedure Follow instructions from your doctor about what you cannot eat or drink. Do not have any drinks or foods that have caffeine in them for 24 hours before the test, or as told by your doctor. This includes coffee, tea (even decaf tea), sodas, chocolate, and cocoa. Hold beta-blocker medicines for night before and the morning of. Take all other BP meds that aren't Bbs.  If you use an inhaler, bring it with you to the test. Do not use any products that have nicotine or tobacco in them, such as cigarettes and e-cigarettes. Stop using them at least 4 hours before the test. If you need help quitting, ask your doctor.   Your physician recommends that you schedule a follow-up appointment in: 4-6 weeks (Thur 12/24/23 at 9:15)

## 2023-11-19 ENCOUNTER — Ambulatory Visit: Payer: BC Managed Care – PPO

## 2023-11-24 ENCOUNTER — Ambulatory Visit
Admission: RE | Admit: 2023-11-24 | Discharge: 2023-11-24 | Disposition: A | Discharge status: Home / Self Care | Payer: BC Managed Care – PPO | Source: Ambulatory Visit | Attending: Internal Medicine | Admitting: Internal Medicine

## 2023-11-24 DIAGNOSIS — R002 Palpitations: Secondary | ICD-10-CM | POA: Insufficient documentation

## 2023-11-24 DIAGNOSIS — R55 Syncope and collapse: Secondary | ICD-10-CM | POA: Diagnosis not present

## 2023-11-25 LAB — EXERCISE TOLERANCE TEST
Angina Index: 0
Duke Treadmill Score: 13
Estimated workload: 13.4
Exercise duration (min): 12 min
Exercise duration (sec): 48 s
MPHR: 192 {beats}/min
Peak HR: 173 {beats}/min
Percent HR: 90 %
Rest HR: 66 {beats}/min
ST Depression (mm): 0 mm

## 2023-11-26 ENCOUNTER — Ambulatory Visit: Payer: BC Managed Care – PPO | Admitting: Cardiovascular Disease

## 2023-12-08 DIAGNOSIS — R002 Palpitations: Secondary | ICD-10-CM | POA: Diagnosis not present

## 2023-12-08 DIAGNOSIS — R55 Syncope and collapse: Secondary | ICD-10-CM | POA: Diagnosis not present

## 2023-12-15 ENCOUNTER — Ambulatory Visit: Admission: RE | Admit: 2023-12-15 | Payer: BC Managed Care – PPO | Source: Ambulatory Visit

## 2023-12-24 ENCOUNTER — Ambulatory Visit: Payer: BC Managed Care – PPO | Admitting: Internal Medicine

## 2024-01-15 ENCOUNTER — Other Ambulatory Visit: Payer: Self-pay | Admitting: Internal Medicine

## 2024-01-21 ENCOUNTER — Telehealth: Payer: Self-pay | Admitting: Internal Medicine

## 2024-01-21 NOTE — Telephone Encounter (Signed)
Lvm to confirm appt for 01/22/24

## 2024-01-22 ENCOUNTER — Ambulatory Visit: Payer: BC Managed Care – PPO | Admitting: Internal Medicine

## 2024-01-22 ENCOUNTER — Telehealth: Payer: Self-pay | Admitting: Internal Medicine

## 2024-01-22 NOTE — Telephone Encounter (Signed)
Pt cancelled appt on 01/22/24 and said he will cb to r/s if he needs to

## 2024-04-07 ENCOUNTER — Other Ambulatory Visit: Payer: Self-pay | Admitting: Internal Medicine

## 2024-04-19 DIAGNOSIS — S90465A Insect bite (nonvenomous), left lesser toe(s), initial encounter: Secondary | ICD-10-CM | POA: Diagnosis not present

## 2024-07-06 ENCOUNTER — Telehealth

## 2024-07-07 ENCOUNTER — Ambulatory Visit (INDEPENDENT_AMBULATORY_CARE_PROVIDER_SITE_OTHER): Admitting: Internal Medicine

## 2024-07-07 ENCOUNTER — Encounter: Payer: Self-pay | Admitting: Internal Medicine

## 2024-07-07 VITALS — BP 90/64 | HR 84 | Temp 97.4°F | Ht 72.5 in | Wt 198.0 lb

## 2024-07-07 DIAGNOSIS — F419 Anxiety disorder, unspecified: Secondary | ICD-10-CM | POA: Diagnosis not present

## 2024-07-07 MED ORDER — ESCITALOPRAM OXALATE 10 MG PO TABS: 10.0000 mg | ORAL_TABLET | Freq: Every day | ORAL | 1 refills | Status: DC

## 2024-07-07 NOTE — Progress Notes (Signed)
   Subjective:    Patient ID: Jerry Werner, male    DOB: August 30, 1996, 28 y.o.   MRN: [id number]  HPI Here to follow up on palpitations/spells  Zio monitor was okay ETT was normal Mini--echo by Dr Chet was okay--but never had the full one Diagnosed with vasovagal syncope  Did get put on anxiety medication--lexapro   Started this around December Had improvement in symptoms---but would still get minor symptoms Decided to stop it 2-3 months ago--and did seem to be okay at first  Now back to having some more symptoms--in the past 2 weeks Actually didn't feel like he could drive--due to dizziness Can feel it come on---gets SOB, vision gets shaky, hands get tingly, heart races and sense that something is stuck in my throat May seem like he is missing a breath Does get sense that he might be dying --and BP drops  No depression  Current Outpatient Medications on File Prior to Visit  Medication Sig Dispense Refill   CREATINE PO Take by mouth.     Dietary Management Product (NEOKE BCAA4) POWD Take by mouth.     Multiple Vitamin (MULTIVITAMIN) capsule Take 1 capsule by mouth daily.     No current facility-administered medications on file prior to visit.    No Known Allergies  Past Medical History:  Diagnosis Date   Hx of acne    [facility]   Hx of dermatitis    [facility]   Lung contusion 06/2018    Past Surgical History:  Procedure Laterality Date   NO PAST SURGERIES      Family History  Problem Relation Age of Onset   Breast cancer Paternal Aunt    Dementia Paternal Grandmother    Lung cancer Paternal Grandfather    Diabetes Neg Hx     Social History   Socioeconomic History   Marital status: Single    Spouse name: Not on file   Number of children: 0   Years of education: Not on file   Highest education level: Not on file  Occupational History   Occupation: Environmental Health    Comment: [employer]  Tobacco Use   Smoking status: Never     Passive exposure: Never   Smokeless tobacco: Never  Substance and Sexual Activity   Alcohol use: Yes    Comment: weekends   Drug use: Never   Sexual activity: Not on file  Other Topics Concern   Not on file  Social History Narrative   Not on file   Social Drivers of Health   Financial Resource Strain: Not on file  Food Insecurity: Not on file  Transportation Needs: Not on file  Physical Activity: Not on file  Stress: Not on file  Social Connections: Not on file  Intimate Partner Violence: Not on file   Review of Systems Not sleeping enough--but does better on weekends Engaged and planning November wedding Eats well Working out regularly in gym Quit caffeine except for a couple of sodas    Objective:   Physical Exam Constitutional:      Appearance: Normal appearance.  Neurological:     Mental Status: He is alert.  Psychiatric:        Mood and Affect: Mood normal.        Behavior: Behavior normal.            Assessment & Plan:

## 2024-07-07 NOTE — Patient Instructions (Signed)
How to help anxiety - without medication.   1) Regular Exercise - walking, jogging, cycling, dancing, strength training   2)  Begin a Mindfulness/Meditation practice -- this can take a little as 3 minutes and is helpful for all kinds of mood issues  -- You can find resources in books  -- Or you can download apps like  ---- Headspace App (which currently has free content called "Weathering the Storm")  ---- Calm (which has a few free options)  ---- Insignt Timer  ---- Stop, Breathe & Think   # With each of these Apps - you should decline the "start free trial" offer and as you search through the App should be able to access some of their free content. You can also chose to pay for the content if you find one that works well for you.   # Many of them also offer sleep specific content which may help with insomnia   3) Healthy Diet  -- Avoid or decrease Caffeine  -- Avoid or decrease Alcohol  -- Drink plenty of water, have a balanced diet  -- Avoid cigarettes and marijuana (as well as other recreational drugs)   4) Consider contacting a professional therapist   

## 2024-07-07 NOTE — Assessment & Plan Note (Signed)
 Did well with escitalopram ----was on 20mg  and tolerated Discussed this at length---he prefers no meds but I think this would be good to restart---but try 10mg  Information about apps for anxiety/meditation, etc

## 2024-08-12 ENCOUNTER — Ambulatory Visit (INDEPENDENT_AMBULATORY_CARE_PROVIDER_SITE_OTHER): Admitting: Nurse Practitioner

## 2024-08-12 VITALS — BP 114/64 | HR 65 | Temp 97.9°F | Ht 72.5 in | Wt 198.8 lb

## 2024-08-12 DIAGNOSIS — F419 Anxiety disorder, unspecified: Secondary | ICD-10-CM | POA: Diagnosis not present

## 2024-08-12 MED ORDER — SERTRALINE HCL 50 MG PO TABS: 50.0000 mg | ORAL_TABLET | Freq: Every day | ORAL | 0 refills | Status: DC

## 2024-08-12 NOTE — Patient Instructions (Signed)
 Nice to see you today  Stop the lexapro  and start the zoloft .  Follow up with me in 2 months for physical and recheck

## 2024-08-12 NOTE — Progress Notes (Signed)
 Established Patient Office Visit  Subjective   Patient ID: Jerry Werner, male    DOB: Feb 17, 1996  Age: 28 y.o. MRN: [id number]  Chief Complaint  Patient presents with   Follow-up    Follow up on medication for anxiety. Pt complains that medication is causing issues with pt sex drive/hormones.     HPI  Discussed the use of AI scribe software for clinical note transcription with the patient, who gave verbal consent to proceed.  History of Present Illness Jerry Werner is a 28 year old male who presents with anxiety and side effects from Lexapro .  He has been experiencing anxiety symptoms and underwent a mini echocardiogram and a heart monitor test, both of which were unremarkable. He was initially placed on Lexapro , which alleviated his symptoms, but discontinuation led to a recurrence of anxiety. He resumed Lexapro  about a month and a half ago.  Until resuming Lexapro , he experienced side effects such as feeling faint, slurred speech, and symptoms associated with low blood pressure, particularly during physical exertion. These side effects have since resolved, and his anxiety symptoms have significantly improved, with only occasional mild episodes. He reports that he is able to work out and engage in daily activities.  He reports a decrease in libido and difficulty with ejaculation since starting Lexapro , affecting his relationship with his fiance. He is currently taking 20 mg of Lexapro  daily in the morning. No thoughts of self-harm or harm to others.  His sleep has improved over the last three weeks, with no issues in sleep quality. No headaches, chest pain, shortness of breath, fever, or chills while on the medication.    Review of Systems  Constitutional:  Negative for chills and fever.  Respiratory:  Negative for shortness of breath.   Cardiovascular:  Negative for chest pain.  Neurological:  Negative for headaches.  Psychiatric/Behavioral:  Negative for hallucinations and  suicidal ideas. The patient has insomnia.       Objective:     BP 114/64   Pulse 65   Temp 97.9 F (36.6 C) (Oral)   Ht 6' 0.5 (1.842 m)   Wt 198 lb 12.8 oz (90.2 kg)   SpO2 98%   BMI 26.59 kg/m    Physical Exam Vitals and nursing note reviewed.  Constitutional:      Appearance: Normal appearance.  Cardiovascular:     Rate and Rhythm: Normal rate and regular rhythm.     Heart sounds: Normal heart sounds.  Pulmonary:     Effort: Pulmonary effort is normal.     Breath sounds: Normal breath sounds.  Neurological:     Mental Status: He is alert.      No results found for any visits on 08/12/24.    The ASCVD Risk score (Arnett DK, et al., 2019) failed to calculate for the following reasons:   The 2019 ASCVD risk score is only valid for ages 51 to 36    Assessment & Plan:   Problem List Items Addressed This Visit       Other   Anxiety disorder with panic attacks - Primary   Relevant Medications   sertraline  (ZOLOFT ) 50 MG tablet  Assessment and Plan Assessment & Plan Generalized anxiety disorder with SSRI-induced sexual dysfunction (delayed ejaculation and decreased libido) Generalized anxiety disorder managed with Lexapro , initially effective but causing significant sexual dysfunction. Symptoms of anxiety improved, but sexual side effects impacted quality of life and relationship. No suicidal ideation or severe depressive symptoms. Sleep quality good. - Switch  from Lexapro  to Zoloft , starting with 50 mg daily, due to lower incidence of sexual side effects. - Discussed potential side effects of Zoloft , including drowsiness, headache, nausea, and diarrhea. - Advised a washout period of about ten days for Lexapro  to clear and Zoloft  to take effect. - Testosterone therapy from hormone clinic should not affect anxiety treatment. - Contact in two weeks to assess resolution of sexual dysfunction and overall response to Zoloft . - Schedule follow-up appointment in two  months for physical examination and comprehensive health evaluation.   Return in about 8 weeks (around 10/07/2024) for CPE and Labs.    Adina Crandall, NP

## 2024-08-26 ENCOUNTER — Telehealth: Payer: Self-pay | Admitting: Nurse Practitioner

## 2024-08-26 NOTE — Telephone Encounter (Signed)
 See if switching from lexapro  to zoloft  has helped the mood and delayed ejaculation

## 2024-08-26 NOTE — Telephone Encounter (Signed)
 Left voicemail for patient to call the office back.

## 2024-08-26 NOTE — Telephone Encounter (Signed)
-----   Message from [facility] sent at 08/12/2024  4:14 PM EDT ----- Regarding: Panic/Delayed ejaculatoin See if switching from lexapro  to zoloft  has helped the mood and delayed ejaculation

## 2024-09-02 NOTE — Telephone Encounter (Signed)
 Left detailed voicemail for patient to call the office back.

## 2024-09-05 NOTE — Telephone Encounter (Signed)
 Called patient had noticed improvement with all symptoms. Will reach out if any changes or questions.

## 2024-11-14 ENCOUNTER — Other Ambulatory Visit: Payer: Self-pay | Admitting: Nurse Practitioner

## 2024-11-14 DIAGNOSIS — F419 Anxiety disorder, unspecified: Secondary | ICD-10-CM

## 2025-01-27 ENCOUNTER — Encounter: Admitting: Nurse Practitioner
